# Patient Record
Sex: Male | Born: 1985 | Race: Black or African American | Hispanic: No | Marital: Single
Health system: Western US, Academic
[De-identification: ages and names within clinical notes are randomized; demographics above are authoritative.]

## PROBLEM LIST (undated history)

## (undated) DIAGNOSIS — F419 Anxiety disorder, unspecified: Secondary | ICD-10-CM

---

## 2018-03-30 ENCOUNTER — Emergency Department (HOSPITAL_COMMUNITY): Payer: Self-pay

## 2018-03-30 ENCOUNTER — Other Ambulatory Visit: Payer: Self-pay

## 2018-03-30 ENCOUNTER — Emergency Department (HOSPITAL_COMMUNITY)
Admission: EM | Admit: 2018-03-30 | Discharge: 2018-03-30 | Disposition: A | Discharge status: Home / Self Care | Payer: Self-pay | Attending: Emergency Medicine | Admitting: Emergency Medicine

## 2018-03-30 ENCOUNTER — Encounter (HOSPITAL_COMMUNITY): Payer: Self-pay | Admitting: Emergency Medicine

## 2018-03-30 DIAGNOSIS — J069 Acute upper respiratory infection, unspecified: Secondary | ICD-10-CM | POA: Insufficient documentation

## 2018-03-30 HISTORY — DX: Anxiety disorder, unspecified: F41.9

## 2018-03-30 MED ORDER — IBUPROFEN 800 MG PO TABS
800.0000 mg | ORAL_TABLET | Freq: Once | ORAL | Status: AC
  Administered 2018-03-30: 800 mg via ORAL
  Filled 2018-03-30: qty 1

## 2018-03-30 MED ORDER — BENZONATATE 100 MG PO CAPS: 100.0000 mg | ORAL_CAPSULE | Freq: Three times a day (TID) | ORAL | 0 refills | Status: AC | PRN

## 2018-03-30 MED ORDER — FLUTICASONE PROPIONATE 50 MCG/ACT NA SUSP: 1.0000 | Freq: Every day | NASAL | 2 refills | Status: AC

## 2018-03-30 MED ORDER — IBUPROFEN 800 MG PO TABS: 800.0000 mg | ORAL_TABLET | Freq: Three times a day (TID) | ORAL | 0 refills | Status: AC

## 2018-03-30 NOTE — ED Notes (Signed)
Patient transported to X-ray 

## 2018-03-30 NOTE — ED Triage Notes (Signed)
Pt states 2 days of sneezing, cough with yellow mucous, pt states "its the pollen" Pt states he has taken benadryl 2 days ago. States he stopped taking it for work. Denies headache.

## 2018-03-30 NOTE — ED Provider Notes (Signed)
[facility] Provider Note   CSN: [id number] Arrival date & time: 03/30/18  1500     History   Chief Complaint Chief Complaint  Patient presents with  . URI    HPI Oscar Sawyer is a 32 y.o. male with history of anxiety who presents to the emergency department today complaining of URI symptoms over the past 2 days.  Patient states he has had congestion, rhinorrhea, sore throat, and productive cough with clean green/mucousy sputum.  He states that he had a lot of significant coughing spells the day that his symptoms started and resulted in some anterior chest wall discomfort, he states this occurs with coughing, with a deep breath, and if he presses on his chest.  If he is resting at baseline he has no chest discomfort.  Has not tried intervention prior to arrival.  No other specific alleviating/aggravating factors.  Denies fever chills, dyspnea, leg pain/swelling, hemoptysis, recent surgery/trauma, recent long travel, hormone use, personal hx of cancer, or hx of DVT/PE.    HPI  Past Medical History:  Diagnosis Date  . Anxiety     There are no active problems to display for this patient.   History reviewed. No pertinent surgical history.      Home Medications    Prior to Admission medications   Not on File    Family History History reviewed. No pertinent family history.  Social History Social History   Tobacco Use  . Smoking status: Not on file  Substance Use Topics  . Alcohol use: Not on file  . Drug use: Not on file     Allergies   Patient has no known allergies.   Review of Systems Review of Systems  Constitutional: Negative for chills and fever.  HENT: Positive for congestion, rhinorrhea and sore throat. Negative for ear pain.   Respiratory: Positive for cough. Negative for shortness of breath.   Cardiovascular: Positive for chest pain (anterior chest wall with coughing/palpation/deep breaths). Negative for  palpitations and leg swelling.  Gastrointestinal: Negative for nausea and vomiting.  Musculoskeletal: Negative for myalgias (leg).   Physical Exam Updated Vital Signs BP 109/81   Pulse 63   Temp 98.8 F (37.1 C) (Oral)   Resp 18   SpO2 100%   Physical Exam  Constitutional: He appears well-developed and well-nourished.  Non-toxic appearance. No distress.  HENT:  Head: Normocephalic and atraumatic.  Right Ear: Tympanic membrane is not erythematous, not retracted and not bulging.  Left Ear: Tympanic membrane is not erythematous, not retracted and not bulging.  Nose: Mucosal edema (congestion, somewhat boggy turbinates) present. Right sinus exhibits no maxillary sinus tenderness and no frontal sinus tenderness. Left sinus exhibits no maxillary sinus tenderness and no frontal sinus tenderness.  Mouth/Throat: Uvula is midline and oropharynx is clear and moist. No oropharyngeal exudate or posterior oropharyngeal erythema.  Eyes: Pupils are equal, round, and reactive to light. EOM are normal. Right eye exhibits no discharge. Left eye exhibits no discharge. Right conjunctiva is injected (minimal). Left conjunctiva is injected (minimal).  Neck: Normal range of motion. Neck supple.  Cardiovascular: Normal rate and regular rhythm.  No murmur heard. Pulmonary/Chest: Effort normal and breath sounds normal. No stridor. No respiratory distress. He has no wheezes. He has no rhonchi. He has no rales. He exhibits tenderness (anterior). He exhibits no crepitus, no edema, no deformity, no swelling and no retraction.  Abdominal: Soft. He exhibits no distension. There is no tenderness.  Lymphadenopathy:    He  has no cervical adenopathy.  Neurological: He is alert.  Clear speech.   Skin: Skin is warm and dry. No rash noted.  Psychiatric: He has a normal mood and affect. His behavior is normal.  Nursing note and vitals reviewed.    ED Treatments / Results  Labs (all labs ordered are listed, but only  abnormal results are displayed) Labs Reviewed - No data to display  EKG None  Radiology Dg Chest 2 View  Result Date: 03/30/2018 CLINICAL DATA:  Cough and shortness of breath EXAM: CHEST - 2 VIEW COMPARISON:  None. FINDINGS: The heart size and mediastinal contours are within normal limits. Both lungs are clear. The visualized skeletal structures are unremarkable. IMPRESSION: No active cardiopulmonary disease. Electronically Signed   By: Alcide Clever M.D.   On: 03/30/2018 16:46    Procedures Procedures (including critical care time)  Medications Ordered in ED Medications  ibuprofen (ADVIL,MOTRIN) tablet 800 mg (800 mg Oral Given 03/30/18 1708)     Initial Impression / Assessment and Plan / ED Course  I have reviewed the triage vital signs and the nursing notes.  Pertinent labs & imaging results that were available during my care of the patient were reviewed by me and considered in my medical decision making (see chart for details).   Patient presents with URI symptoms that started 2 days ago.  Patient is nontoxic-appearing, no apparent distress, vitals WNL.  Patient's lungs are CTA, chest x-ray ordered per triage negative for infiltrate, no respiratory distress, doubt pneumonia.  Given onset of symptoms 2 days prior, patient is afebrile, no sinus tenderness, doubt acute bacterial sinusitis.  Centor score 0, doubt strep pharyngitis.  Patient with chest discomfort only with coughing/deep breath/palpation to chest wall (this is reproducible), he is PERC negative, suspect pleurisy vs. Chest wall muscle discomfort.  No signs of respiratory distress. Likely allergic versus viral etiology.  Will treat supportively with Flonase, Tessalon, and ibuprofen. I discussed results, treatment plan, need for PCP follow-up, and return precautions with the patient. Provided opportunity for questions, patient confirmed understanding and is in agreement with plan.   Final Clinical Impressions(s) / ED Diagnoses     Final diagnoses:  Upper respiratory tract infection, unspecified type    ED Discharge Orders        Ordered    fluticasone (FLONASE) 50 MCG/ACT nasal spray  Daily     03/30/18 1652    benzonatate (TESSALON) 100 MG capsule  3 times daily PRN     03/30/18 1652    ibuprofen (ADVIL,MOTRIN) 800 MG tablet  3 times daily     03/30/18 [id number] [place] R, PA-C 03/30/18 1725    Raeford Razor, MD 04/01/18 1008

## 2018-03-30 NOTE — Discharge Instructions (Addendum)
You were seen in the emergency department today for upper respiratory tract symptoms.  Your x-ray did not show signs of pneumonia.  We suspect your symptoms are likely related to allergies or to a virus.  I have prescribed you multiple medications to treat your symptoms.   -Flonase to be used 1 spray in each nostril daily.  This medication is used to treat your congestion.  -Tessalon can be taken once every 8 hours as needed.  This medication is used to treat your cough.  -Ibuprofen to be taken once every 8 hours as needed for pain.  We have prescribed you new medication(s) today. Discuss the medications prescribed today with your pharmacist as they can have adverse effects and interactions with your other medicines including over the counter and prescribed medications. Seek medical evaluation if you start to experience new or abnormal symptoms after taking one of these medicines, seek care immediately if you start to experience difficulty breathing, feeling of your throat closing, facial swelling, or rash as these could be indications of a more serious allergic reaction   You will need to follow-up with your primary care provider in 1 week if your symptoms have not improved.  If you do not have a primary care provider one is provided in your discharge instructions.  Return to the emergency department for any new or worsening symptoms or any other concerns you may have.

## 2018-06-27 ENCOUNTER — Emergency Department (HOSPITAL_COMMUNITY)
Admission: EM | Admit: 2018-06-27 | Discharge: 2018-06-27 | Disposition: A | Discharge status: Home / Self Care | Payer: Self-pay | Attending: Emergency Medicine | Admitting: Emergency Medicine

## 2018-06-27 ENCOUNTER — Encounter (HOSPITAL_COMMUNITY): Payer: Self-pay | Admitting: Emergency Medicine

## 2018-06-27 DIAGNOSIS — R1084 Generalized abdominal pain: Secondary | ICD-10-CM

## 2018-06-27 DIAGNOSIS — E860 Dehydration: Secondary | ICD-10-CM

## 2018-06-27 DIAGNOSIS — F17210 Nicotine dependence, cigarettes, uncomplicated: Secondary | ICD-10-CM | POA: Insufficient documentation

## 2018-06-27 LAB — COMPREHENSIVE METABOLIC PANEL
ALT: 15 U/L (ref 0–44)
ANION GAP: 9 (ref 5–15)
AST: 23 U/L (ref 15–41)
Albumin: 4.2 g/dL (ref 3.5–5.0)
Alkaline Phosphatase: 41 U/L (ref 38–126)
BILIRUBIN TOTAL: 0.8 mg/dL (ref 0.3–1.2)
BUN: 15 mg/dL (ref 6–20)
CHLORIDE: 105 mmol/L (ref 98–111)
CO2: 26 mmol/L (ref 22–32)
Calcium: 9.1 mg/dL (ref 8.9–10.3)
Creatinine, Ser: 1.3 mg/dL — ABNORMAL HIGH (ref 0.61–1.24)
GFR calc Af Amer: >60 mL/min (ref >60)
Glucose, Bld: 95 mg/dL (ref 70–99)
POTASSIUM: 3.9 mmol/L (ref 3.5–5.1)
Sodium: 140 mmol/L (ref 135–145)
TOTAL PROTEIN: 6.9 g/dL (ref 6.5–8.1)

## 2018-06-27 LAB — URINALYSIS, ROUTINE W REFLEX MICROSCOPIC
GLUCOSE, UA: NEGATIVE mg/dL
Hgb urine dipstick: NEGATIVE
KETONES UR: 5 mg/dL — AB
LEUKOCYTES UA: NEGATIVE
NITRITE: NEGATIVE
Protein, ur: NEGATIVE mg/dL
SPECIFIC GRAVITY, URINE: 1.033 — AB (ref 1.005–1.030)
pH: 5 (ref 5.0–8.0)

## 2018-06-27 LAB — CBC
HEMATOCRIT: 49.8 % (ref 39.0–52.0)
HEMOGLOBIN: 16.1 g/dL (ref 13.0–17.0)
MCH: 31.1 pg (ref 26.0–34.0)
MCHC: 32.3 g/dL (ref 30.0–36.0)
MCV: 96.1 fL (ref 78.0–100.0)
Platelets: 173 10*3/uL (ref 150–400)
RBC: 5.18 MIL/uL (ref 4.22–5.81)
RDW: 11.9 % (ref 11.5–15.5)
WBC: 7.2 10*3/uL (ref 4.0–10.5)

## 2018-06-27 LAB — LIPASE, BLOOD: LIPASE: 34 U/L (ref 11–51)

## 2018-06-27 MED ORDER — ACETAMINOPHEN 500 MG PO TABS
1000.0000 mg | ORAL_TABLET | Freq: Once | ORAL | Status: AC
  Administered 2018-06-27: 1000 mg via ORAL
  Filled 2018-06-27: qty 2

## 2018-06-27 MED ORDER — SODIUM CHLORIDE 0.9 % IV SOLN: 25.0000 mg | INTRAVENOUS | Status: DC | PRN

## 2018-06-27 NOTE — Discharge Instructions (Addendum)
Please continue to hydrate appropriately (6-8 large cups of water per day). You can take Tylenol 1000 mg every 6 hours for abdominal pain and discomfort. If the symptoms for 1 week, please see you primary care doctor.  We have attached a list of local primary care doctors in the area. Please follow-up with one of them to retest your urine and blood sugar.   We collected samples for STD testing (Gonorrhea, Chlamydia, syphilis, and HIV). You should receive a call if anything comes back abnormal. You can also find your test results on the patient portal (directions attached).

## 2018-06-27 NOTE — ED Notes (Signed)
ED Provider at bedside. [place] 

## 2018-06-27 NOTE — ED Triage Notes (Signed)
Pt states over the weekend he developed a pressure in his lower abd. Pt states when he urinates, he feels pressure. No pain with urination. States his urine is dark. Pt also he was his CBG checked because he was told he was borderline diabetic.

## 2018-06-27 NOTE — ED Provider Notes (Signed)
[facility] Provider Note   CSN: [id number] Arrival date & time: 06/27/18  1008     History   Chief Complaint Chief Complaint  Patient presents with  . Abdominal Pain    HPI Oscar Sawyer is a 32 y.o. male with no significant past medical history who presents with a 4 day history of abdominal pain. Patient reports that he woke up on Friday morning and had an abrupt onset of "abdominal pressure" in his lower suprapubic region. He went to use the restroom and began urinating. He says that he stopped urinating and had "dribbling" of urine from his penis. He endorses increased frequency but denies dysuria, hematuria, fevers, penile discharge.  He reports that he is sexually active and has been using condoms with sexual partners. He does report that during his last sexual encounter the condom may have broken.   He denies diarrhea, SOB, chest pain. He reports a history of hemorrhoidectomy 7-8 months ago but has not undergone an abdominal surgery in the past.     Past Medical History:  Diagnosis Date  . Anxiety     There are no active problems to display for this patient.   History reviewed. No pertinent surgical history.      Home Medications    Prior to Admission medications   Medication Sig Start Date End Date Taking? Authorizing Provider  benzonatate (TESSALON) 100 MG capsule Take 1 capsule (100 mg total) by mouth 3 (three) times daily as needed. Patient not taking: Reported on 06/27/2018 03/30/18   Petrucelli, Lelon MastSamantha R, PA-C  fluticasone (FLONASE) 50 MCG/ACT nasal spray Place 1 spray into both nostrils daily. Patient not taking: Reported on 06/27/2018 03/30/18   Petrucelli, Pleas KochSamantha R, PA-C  ibuprofen (ADVIL,MOTRIN) 800 MG tablet Take 1 tablet (800 mg total) by mouth 3 (three) times daily. Patient not taking: Reported on 06/27/2018 03/30/18   Petrucelli, Pleas KochSamantha R, PA-C    Family History History reviewed. No pertinent family  history.  Social History Social History   Tobacco Use  . Smoking status: Current Some Day Smoker    Types: Cigarettes  . Smokeless tobacco: Never Used  Substance Use Topics  . Alcohol use: Yes    Comment: occ  . Drug use: Never     Allergies   Patient has no known allergies.   Review of Systems Review of Systems  Constitutional: Negative for activity change, appetite change, chills and fever.  HENT: Negative for congestion and sore throat.   Respiratory: Negative for cough, choking, chest tightness and shortness of breath.   Cardiovascular: Negative for chest pain and palpitations.  Gastrointestinal: Positive for abdominal pain. Negative for abdominal distention, constipation, diarrhea, nausea and vomiting.  Genitourinary: Positive for difficulty urinating and frequency. Negative for decreased urine volume, discharge, dysuria, flank pain, hematuria, penile pain, penile swelling, scrotal swelling, testicular pain and urgency.  Musculoskeletal: Positive for back pain.  Skin: Negative for rash.  Neurological: Negative for dizziness, light-headedness and headaches.  All other systems reviewed and are negative.    Physical Exam Updated Vital Signs BP 125/81   Pulse (!) 53   Temp 98.3 F (36.8 C) (Oral)   Resp 16   Ht 5\' 11"  (1.803 m)   Wt 81.6 kg (180 lb)   SpO2 99%   BMI 25.10 kg/m   Physical Exam  Constitutional: He is oriented to person, place, and time. He appears well-developed and well-nourished.  HENT:  Head: Normocephalic and atraumatic.  Eyes: Pupils are equal,  round, and reactive to light. EOM are normal.  Cardiovascular: Normal rate, regular rhythm and normal heart sounds.  Pulmonary/Chest: Effort normal and breath sounds normal.  Abdominal: Normal appearance and bowel sounds are normal. There is generalized tenderness. There is no CVA tenderness.  Genitourinary: Testes normal and penis normal.  Genitourinary Comments: No penile discharge  Neurological:  He is alert and oriented to person, place, and time.  Skin: Skin is warm and dry.  Psychiatric: He has a normal mood and affect. His behavior is normal.     ED Treatments / Results  Labs (all labs ordered are listed, but only abnormal results are displayed) Labs Reviewed  COMPREHENSIVE METABOLIC PANEL - Abnormal; Notable for the following components:      Result Value   Creatinine, Ser 1.30 (*)    All other components within normal limits  URINALYSIS, ROUTINE W REFLEX MICROSCOPIC - Abnormal; Notable for the following components:   Specific Gravity, Urine 1.033 (*)    Bilirubin Urine SMALL (*)    Ketones, ur 5 (*)    All other components within normal limits  LIPASE, BLOOD  CBC  RPR  HIV ANTIBODY (ROUTINE TESTING)  GC/CHLAMYDIA PROBE AMP ([place]) NOT AT [facility]    EKG None  Radiology No results found.  Procedures Procedures (including critical care time)  Medications Ordered in ED Medications  acetaminophen (TYLENOL) tablet 1,000 mg (has no administration in time range)     Initial Impression / Assessment and Plan / ED Course  I have reviewed the triage vital signs and the nursing notes.  Pertinent labs & imaging results that were available during my care of the patient were reviewed by me and considered in my medical decision making (see chart for details).  Clinical Course as of Jun 28 1247  Tue Jun 27, 2018  1207 Creatinine(!): 1.30 [JP]  1207 Creatinine(!): 1.30 [JP]  1207 Specific Gravity, Urine(!): 1.033 [JP]  1207 Specific Gravity, Urine(!): 1.033 [JP]    Clinical Course User Index [JP] Synetta Shadow, MD    32 y.o. male with no significant past medical history who presents with a 4 day history of abdominal pain. Differential diagnosis includes dehydration and sexually transmitted infection (less likely due lack of penile discharge). Symptoms are less likely due to a urinary tract infection given lack of dysuria and urinalysis without signs of  UTI.  #Abdominal pain - Urinalysis shows high specific gravity (1.033), small bilirubin, and ketonuria (5). Most consistent with dehydration. Encouraged oral rehydration in the ED. Recommended patient drink 6-8 large glasses of water everyday.  - Lipase negative - Ordered GC/Chlamydia, HIV, and Syphilis test to rule out STI. Patient will be contacted with abnormal results  #Acute Kidney Injury (creatinine 1.30) - Most likely pre-renal in nature given history of decreased PO fluid intake and urinalysis findings. - Recommended patient follow-up with PCP for repeat urinalysis.   Final Clinical Impressions(s) / ED Diagnoses   Final diagnoses:  Generalized abdominal pain  Dehydration    ED Discharge Orders    None       Synetta Shadow, MD 06/27/18 0981    Marily Memos, MD 06/27/18 2108

## 2018-06-27 NOTE — ED Notes (Signed)
ED Provider at bedside. 

## 2018-06-27 NOTE — ED Notes (Signed)
Patient came out stated that he wants to go. He states that he has wants to go home.

## 2018-06-28 LAB — HIV ANTIBODY (ROUTINE TESTING W REFLEX): HIV SCREEN 4TH GENERATION: NONREACTIVE

## 2018-06-28 LAB — GC/CHLAMYDIA PROBE AMP (~~LOC~~) NOT AT ARMC
CHLAMYDIA, DNA PROBE: NEGATIVE
NEISSERIA GONORRHEA: NEGATIVE

## 2018-06-28 LAB — RPR: RPR: NONREACTIVE

## 2018-08-12 ENCOUNTER — Encounter (HOSPITAL_COMMUNITY): Payer: Self-pay | Admitting: Emergency Medicine

## 2018-08-12 ENCOUNTER — Other Ambulatory Visit: Payer: Self-pay

## 2018-08-12 ENCOUNTER — Emergency Department (HOSPITAL_COMMUNITY)
Admission: EM | Admit: 2018-08-12 | Discharge: 2018-08-12 | Disposition: A | Discharge status: Home / Self Care | Payer: Self-pay | Attending: Emergency Medicine | Admitting: Emergency Medicine

## 2018-08-12 DIAGNOSIS — Z202 Contact with and (suspected) exposure to infections with a predominantly sexual mode of transmission: Secondary | ICD-10-CM | POA: Insufficient documentation

## 2018-08-12 DIAGNOSIS — F17210 Nicotine dependence, cigarettes, uncomplicated: Secondary | ICD-10-CM | POA: Insufficient documentation

## 2018-08-12 DIAGNOSIS — N453 Epididymo-orchitis: Secondary | ICD-10-CM | POA: Insufficient documentation

## 2018-08-12 LAB — CBC
HCT: 44.5 % (ref 39.0–52.0)
Hemoglobin: 14.3 g/dL (ref 13.0–17.0)
MCH: 31 pg (ref 26.0–34.0)
MCHC: 32.1 g/dL (ref 30.0–36.0)
MCV: 96.3 fL (ref 78.0–100.0)
Platelets: 159 K/uL (ref 150–400)
RBC: 4.62 MIL/uL (ref 4.22–5.81)
RDW: 11.9 % (ref 11.5–15.5)
WBC: 5.9 K/uL (ref 4.0–10.5)

## 2018-08-12 LAB — URINALYSIS, ROUTINE W REFLEX MICROSCOPIC
Bilirubin Urine: NEGATIVE
Glucose, UA: NEGATIVE mg/dL
Hgb urine dipstick: NEGATIVE
Ketones, ur: NEGATIVE mg/dL
Leukocytes, UA: NEGATIVE
Nitrite: NEGATIVE
Protein, ur: NEGATIVE mg/dL
Specific Gravity, Urine: 1.025 (ref 1.005–1.030)
pH: 7 (ref 5.0–8.0)

## 2018-08-12 LAB — BASIC METABOLIC PANEL WITH GFR
Anion gap: 6 (ref 5–15)
BUN: 13 mg/dL (ref 6–20)
CO2: 28 mmol/L (ref 22–32)
Calcium: 8.8 mg/dL — ABNORMAL LOW (ref 8.9–10.3)
Chloride: 104 mmol/L (ref 98–111)
Creatinine, Ser: 1.12 mg/dL (ref 0.61–1.24)
GFR calc Af Amer: >60 mL/min (ref >60)
GFR calc non Af Amer: >60 mL/min (ref >60)
Glucose, Bld: 128 mg/dL — ABNORMAL HIGH (ref 70–99)
Potassium: 3.8 mmol/L (ref 3.5–5.1)
Sodium: 138 mmol/L (ref 135–145)

## 2018-08-12 MED ORDER — CEFTRIAXONE SODIUM 250 MG IJ SOLR
250.0000 mg | Freq: Once | INTRAMUSCULAR | Status: AC
  Administered 2018-08-12: 250 mg via INTRAMUSCULAR
  Filled 2018-08-12: qty 250

## 2018-08-12 MED ORDER — LIDOCAINE HCL (PF) 1 % IJ SOLN
INTRAMUSCULAR | Status: AC
  Administered 2018-08-12: 2 mL
  Filled 2018-08-12: qty 5

## 2018-08-12 MED ORDER — DOXYCYCLINE HYCLATE 100 MG PO CAPS: 100.0000 mg | ORAL_CAPSULE | Freq: Two times a day (BID) | ORAL | 0 refills | Status: AC

## 2018-08-12 NOTE — ED Triage Notes (Signed)
Pt reports urinary frequency and painful urination for the past few days. Denies any discharge, fever or chills

## 2018-08-12 NOTE — ED Notes (Signed)
While reviewing discharge instructions patient stated he was to be given antibiotic shot. PA notified and waiting orders prior to discharge.

## 2018-08-12 NOTE — Discharge Instructions (Signed)
Please take all of your antibiotics until finished!   You may develop abdominal discomfort or diarrhea from the antibiotic.  You may help offset this with probiotics which you can buy or get in yogurt. Do not eat  or take the probiotics until 2 hours after your antibiotic.   Alternate 600 mg of ibuprofen and [phone] mg of Tylenol every 3 hours as needed for pain. Do not exceed 4000 mg of Tylenol daily.  Wear underwear that will support you such as briefs.  This will help with testicular pain.  You have lab work pending that you will be called if it is abnormal.  If you have any abnormal STD test you should inform your sexual partners and they should go to the health department for further testing and treatment.  Return to the emergency department if any concerning signs or symptoms develop such as fevers, worsening abdominal pain, persistent vomiting.

## 2018-08-12 NOTE — ED Provider Notes (Signed)
[facility] EMERGENCY DEPARTMENT Provider Note   CSN: [id number] Arrival date & time: 08/12/18  0908     History   Chief Complaint Chief Complaint  Patient presents with  . Urinary Frequency    HPI Oscar HeadlandLamar Sawyer is a 32 y.o. male presents today for evaluation of acute onset, persisting dysuria and suprapubic pain for 3 days.  He notes mild sensation of tightness in the suprapubic region but does not radiate.  The sensation worsens when urinating.  He notes dysuria and a tingling pain at the tip of his penis.  He denies any urethral discharge, testicular pain or swelling, fevers, nausea, or vomiting.  He denies pain with bowel movements.  He is currently sexually active with one male partner but does not always use protection.  He is requesting STD testing today but declines HIV or syphilis testing.  Has not tried anything for his symptoms.  The history is provided by the patient.    Past Medical History:  Diagnosis Date  . Anxiety     There are no active problems to display for this patient.   History reviewed. No pertinent surgical history.      Home Medications    Prior to Admission medications   Medication Sig Start Date End Date Taking? Authorizing Provider  benzonatate (TESSALON) 100 MG capsule Take 1 capsule (100 mg total) by mouth 3 (three) times daily as needed. Patient not taking: Reported on 06/27/2018 03/30/18   Petrucelli, Pleas KochSamantha R, PA-C  doxycycline (VIBRAMYCIN) 100 MG capsule Take 1 capsule (100 mg total) by mouth 2 (two) times daily for 10 days. 08/12/18 08/22/18  Michela PitcherFawze, Alonnah Lampkins A, PA-C  fluticasone (FLONASE) 50 MCG/ACT nasal spray Place 1 spray into both nostrils daily. Patient not taking: Reported on 06/27/2018 03/30/18   Petrucelli, Pleas KochSamantha R, PA-C  ibuprofen (ADVIL,MOTRIN) 800 MG tablet Take 1 tablet (800 mg total) by mouth 3 (three) times daily. Patient not taking: Reported on 06/27/2018 03/30/18   Petrucelli, Pleas KochSamantha R, PA-C    Family  History No family history on file.  Social History Social History   Tobacco Use  . Smoking status: Current Some Day Smoker    Types: Cigarettes  . Smokeless tobacco: Never Used  Substance Use Topics  . Alcohol use: Yes    Comment: occ  . Drug use: Never     Allergies   Patient has no known allergies.   Review of Systems Review of Systems  Constitutional: Negative for chills and fever.  Respiratory: Negative for shortness of breath.   Cardiovascular: Negative for chest pain.  Gastrointestinal: Positive for abdominal pain. Negative for constipation, diarrhea, nausea and vomiting.  Genitourinary: Positive for dysuria, frequency and urgency. Negative for discharge, hematuria, penile pain, penile swelling and scrotal swelling.     Physical Exam Updated Vital Signs BP 118/68 (BP Location: Right Arm)   Pulse (!) 55   Temp 98.5 F (36.9 C) (Oral)   Resp 12   SpO2 100%   Physical Exam  Constitutional: He appears well-developed and well-nourished. No distress.  Resting comfortably in bed  HENT:  Head: Normocephalic and atraumatic.  Eyes: Conjunctivae are normal. Right eye exhibits no discharge. Left eye exhibits no discharge.  Neck: No JVD present. No tracheal deviation present.  Cardiovascular: Normal rate, regular rhythm and normal heart sounds.  Pulmonary/Chest: Effort normal and breath sounds normal.  Abdominal: Soft. Bowel sounds are normal. He exhibits no distension and no mass. There is tenderness. There is no guarding.  Very  mild discomfort on palpation of the suprapubic region.  Murphy sign absent, Rovsing's absent, no CVA tenderness  Genitourinary:  Genitourinary Comments: Examination performed in the presence of a chaperone.  Bilateral inguinal lymphadenopathy noted.  No scrotal swelling noted although the testes are tender to palpation bilaterally particularly overlying each epididymis.  No abnormal lie, cremasteric reflex intact.  Prehn's sign absent.  No  urethral discharge noted  Musculoskeletal: He exhibits no edema.  Neurological: He is alert.  Skin: Skin is warm and dry. No erythema.  Psychiatric: He has a normal mood and affect. His behavior is normal.  Nursing note and vitals reviewed.    ED Treatments / Results  Labs (all labs ordered are listed, but only abnormal results are displayed) Labs Reviewed  BASIC METABOLIC PANEL - Abnormal; Notable for the following components:      Result Value   Glucose, Bld 128 (*)    Calcium 8.8 (*)    All other components within normal limits  URINE CULTURE  URINALYSIS, ROUTINE W REFLEX MICROSCOPIC  CBC  GC/CHLAMYDIA PROBE AMP ([place]) NOT AT [facility]    EKG None  Radiology No results found.  Procedures Procedures (including critical care time)  Medications Ordered in ED Medications - No data to display   Initial Impression / Assessment and Plan / ED Course  I have reviewed the triage vital signs and the nursing notes.  Pertinent labs & imaging results that were available during my care of the patient were reviewed by me and considered in my medical decision making (see chart for details).     Patient with dysuria and suprapubic pain for 3 days.  He is afebrile, vital signs are stable.  He is nontoxic in appearance.  No peritoneal signs on examination of the abdomen.  GU exam shows testicular and epididymal tenderness to palpation bilaterally.  No evidence of testicular torsion.  No pain with bowel movements to suggest prostatitis.  GC chlamydia cultures obtained.  UA does not show evidence of UTI or nephrolithiasis.  Doubt obstruction, perforation, appendicitis, colitis, or other acute surgical abdominal pathology.  Will discharge with 10-day course of doxycycline.  IM Rocephin given in the ED.  He is aware that cultures are pending and if he has any abnormal lab work he should inform his sexual partners and they should go to the health department for further testing or treatment.   Discussed strict ED return precautions.  Recommend follow-up with PCP as needed. Pt verbalized understanding of and agreement with plan and is safe for discharge home at this time.   Final Clinical Impressions(s) / ED Diagnoses   Final diagnoses:  Epididymo-orchitis, acute    ED Discharge Orders         Ordered    doxycycline (VIBRAMYCIN) 100 MG capsule  2 times daily     08/12/18 1151           Jeanie Sewer, PA-C 08/12/18 1154    Bethann Berkshire, MD 08/13/18 [phone]

## 2018-08-12 NOTE — ED Notes (Signed)
Patient moved into room for PA to assess. Swab at bedside waiting for provider. PA notified.

## 2018-08-13 LAB — URINE CULTURE: CULTURE: NO GROWTH

## 2018-08-15 LAB — GC/CHLAMYDIA PROBE AMP (~~LOC~~) NOT AT ARMC
Chlamydia: NEGATIVE
Neisseria Gonorrhea: NEGATIVE

## 2018-10-13 ENCOUNTER — Encounter (HOSPITAL_BASED_OUTPATIENT_CLINIC_OR_DEPARTMENT_OTHER): Payer: Self-pay

## 2018-10-13 ENCOUNTER — Emergency Department (HOSPITAL_BASED_OUTPATIENT_CLINIC_OR_DEPARTMENT_OTHER)
Admission: EM | Admit: 2018-10-13 | Discharge: 2018-10-13 | Disposition: A | Discharge status: Home / Self Care | Payer: Self-pay | Attending: Emergency Medicine | Admitting: Emergency Medicine

## 2018-10-13 DIAGNOSIS — F17210 Nicotine dependence, cigarettes, uncomplicated: Secondary | ICD-10-CM | POA: Insufficient documentation

## 2018-10-13 DIAGNOSIS — F419 Anxiety disorder, unspecified: Secondary | ICD-10-CM | POA: Insufficient documentation

## 2018-10-13 DIAGNOSIS — R079 Chest pain, unspecified: Secondary | ICD-10-CM | POA: Insufficient documentation

## 2018-10-13 NOTE — Discharge Instructions (Signed)
If your symptoms worsen, or you have additional concerns please seek additional medical care and evaluation.  The [facility] or interactive resource center of [place] has many helpful resources.

## 2018-10-13 NOTE — ED Notes (Signed)
Per EMS pt was at work and had a panic attack, states working 2 jobs and living in the shelter d/t roommates during drugs. C/o chest tightness

## 2018-10-13 NOTE — ED Provider Notes (Addendum)
[facility] EMERGENCY DEPARTMENT Provider Note   CSN: [id number] Arrival date & time: 10/13/18  1338     History   Chief Complaint Chief Complaint  Patient presents with  . Panic Attack    HPI Oscar Sawyer is a 32 y.o. male with past medical history of anxiety, who presents today for evaluation of chest tightness.  He reports that this feels similar to previous panic attacks he has had.  He says that recently he has been under a significant amount of stress.  He moved here from [place] and is been trying to get reestablished.  He is currently staying with a shelter as he reports his roommates are using drugs.  He says that he is working 2 jobs to try and be able to move out of the shelter.  He was at work today checking back and after lunch break when he started to feel tightness in his chest with slight shortness of breath.  It is in the middle of his chest, does not radiate or move.  No diaphoresis, nausea or vomiting.    Does not have any family history of cardiac arrest before the age of 17 or heart attacks.  No personal or family history of blood clots.  No recent surgeries or immobilizations.  Denies any leg swelling, cough, fevers, or coughing up blood.    He denies SI, HI, or AVH.  HPI  Past Medical History:  Diagnosis Date  . Anxiety     There are no active problems to display for this patient.   History reviewed. No pertinent surgical history.      Home Medications    Prior to Admission medications   Medication Sig Start Date End Date Taking? Authorizing Provider  benzonatate (TESSALON) 100 MG capsule Take 1 capsule (100 mg total) by mouth 3 (three) times daily as needed. Patient not taking: Reported on 06/27/2018 03/30/18   Petrucelli, Lelon Mast R, PA-C  fluticasone (FLONASE) 50 MCG/ACT nasal spray Place 1 spray into both nostrils daily. Patient not taking: Reported on 06/27/2018 03/30/18   Petrucelli, Pleas Koch, PA-C  ibuprofen (ADVIL,MOTRIN) 800 MG  tablet Take 1 tablet (800 mg total) by mouth 3 (three) times daily. Patient not taking: Reported on 06/27/2018 03/30/18   Petrucelli, Pleas Koch, PA-C    Family History No family history on file.  Social History Social History   Tobacco Use  . Smoking status: Current Some Day Smoker    Types: Cigarettes  . Smokeless tobacco: Never Used  Substance Use Topics  . Alcohol use: Yes    Comment: occ  . Drug use: Never     Allergies   Patient has no known allergies.   Review of Systems Review of Systems  Constitutional: Negative for chills and fever.  Respiratory: Positive for shortness of breath (Resolved). Negative for cough.   Cardiovascular: Positive for chest pain (Improving). Negative for leg swelling.  Gastrointestinal: Negative for abdominal pain.  Neurological: Negative for weakness and headaches.  All other systems reviewed and are negative.    Physical Exam Updated Vital Signs BP 121/75 (BP Location: Right Arm)   Pulse 70   Temp 98.4 F (36.9 C) (Oral)   Resp 18   SpO2 99%   Physical Exam  Constitutional: He appears well-developed. No distress.  HENT:  Head: Normocephalic and atraumatic.  Neck: Trachea normal and full passive range of motion without pain. No tracheal deviation present.  Cardiovascular: Normal rate, regular rhythm and normal heart sounds.  No murmur  heard. Pulmonary/Chest: Effort normal and breath sounds normal. No respiratory distress.  Abdominal: He exhibits no distension.  Musculoskeletal:  No swelling or tenderness to palpation of bilateral lower extremities.  Neurological: He is alert.  Skin: Skin is warm and dry. He is not diaphoretic.  Psychiatric: He has a normal mood and affect. His speech is normal and behavior is normal. He is not actively hallucinating. Cognition and memory are normal. He does not express impulsivity. He expresses no homicidal and no suicidal ideation. He expresses no suicidal plans and no homicidal plans.    Sleeping when I walked in the room.  He is attentive.  Nursing note and vitals reviewed.    ED Treatments / Results  Labs (all labs ordered are listed, but only abnormal results are displayed) Labs Reviewed - No data to display  EKG None  Radiology No results found.  Procedures Procedures (including critical care time)  Medications Ordered in ED Medications - No data to display   Initial Impression / Assessment and Plan / ED Course  I have reviewed the triage vital signs and the nursing notes.  Pertinent labs & imaging results that were available during my care of the patient were reviewed by me and considered in my medical decision making (see chart for details).    Patient is to be discharged with recommendation to follow up with PCP in regards to today's hospital visit. Chest pain is not likely of cardiac or pulmonary etiology d/t presentation, PERC negative, VSS, no tracheal deviation, no JVD or new murmur, RRR, breath sounds equal bilaterally, EKG without acute abnormalities.  Patient states that he feels like his chest pain is similar to his anxiety related chest pain in the past and is not concerned for other causes.   Pt has been advised to return to the ED if CP becomes exertional, associated with diaphoresis or nausea, radiates to left jaw/arm, worsens or becomes concerning in any way. Pt appears reliable for follow up and is agreeable to discharge. Patient was offered additional testing including, but not limited to blood work, CXR, and observation which he declined.   He denies SI/HI/AVH.  Is calm during interview.  Given resources for outpatient psychiatric care.   Return precautions were discussed with patient who states their understanding.  At the time of discharge patient denied any unaddressed complaints or concerns.  Patient is agreeable for discharge home.  Final Clinical Impressions(s) / ED Diagnoses   Final diagnoses:  Chest pain, unspecified type   Anxiety    ED Discharge Orders    None        Cristina Gong, PA-C 10/13/18 1447    Gwyneth Sprout, MD 10/13/18 1519

## 2018-11-21 ENCOUNTER — Encounter (HOSPITAL_COMMUNITY): Payer: Self-pay | Admitting: Emergency Medicine

## 2018-11-21 ENCOUNTER — Emergency Department (HOSPITAL_COMMUNITY)
Admission: EM | Admit: 2018-11-21 | Discharge: 2018-11-21 | Disposition: A | Discharge status: Home / Self Care | Payer: Self-pay | Attending: Emergency Medicine | Admitting: Emergency Medicine

## 2018-11-21 ENCOUNTER — Other Ambulatory Visit: Payer: Self-pay

## 2018-11-21 DIAGNOSIS — Z202 Contact with and (suspected) exposure to infections with a predominantly sexual mode of transmission: Secondary | ICD-10-CM | POA: Insufficient documentation

## 2018-11-21 DIAGNOSIS — F17210 Nicotine dependence, cigarettes, uncomplicated: Secondary | ICD-10-CM | POA: Insufficient documentation

## 2018-11-21 DIAGNOSIS — R369 Urethral discharge, unspecified: Secondary | ICD-10-CM | POA: Insufficient documentation

## 2018-11-21 LAB — URINALYSIS, ROUTINE W REFLEX MICROSCOPIC
Bilirubin Urine: NEGATIVE
Glucose, UA: NEGATIVE mg/dL
Hgb urine dipstick: NEGATIVE
Ketones, ur: NEGATIVE mg/dL
LEUKOCYTES UA: NEGATIVE
NITRITE: NEGATIVE
Protein, ur: NEGATIVE mg/dL
SPECIFIC GRAVITY, URINE: 1.027 (ref 1.005–1.030)
pH: 7 (ref 5.0–8.0)

## 2018-11-21 LAB — WET PREP, GENITAL
CLUE CELLS WET PREP: NONE SEEN
Sperm: NONE SEEN
TRICH WET PREP: NONE SEEN
WBC, Wet Prep HPF POC: NONE SEEN
YEAST WET PREP: NONE SEEN

## 2018-11-21 LAB — GC/CHLAMYDIA PROBE AMP (~~LOC~~) NOT AT ARMC
CHLAMYDIA, DNA PROBE: NEGATIVE
NEISSERIA GONORRHEA: NEGATIVE

## 2018-11-21 MED ORDER — AZITHROMYCIN 250 MG PO TABS
1000.0000 mg | ORAL_TABLET | Freq: Once | ORAL | Status: AC
  Administered 2018-11-21: 1000 mg via ORAL
  Filled 2018-11-21: qty 4

## 2018-11-21 MED ORDER — CEFTRIAXONE SODIUM 250 MG IJ SOLR
250.0000 mg | Freq: Once | INTRAMUSCULAR | Status: AC
  Administered 2018-11-21: 250 mg via INTRAMUSCULAR
  Filled 2018-11-21: qty 250

## 2018-11-21 MED ORDER — BACITRACIN ZINC 500 UNIT/GM EX OINT
TOPICAL_OINTMENT | Freq: Two times a day (BID) | CUTANEOUS | Status: DC
  Administered 2018-11-21: 06:00:00 via TOPICAL

## 2018-11-21 MED ORDER — IBUPROFEN 400 MG PO TABS
600.0000 mg | ORAL_TABLET | Freq: Once | ORAL | Status: AC
  Administered 2018-11-21: 600 mg via ORAL
  Filled 2018-11-21: qty 1

## 2018-11-21 NOTE — ED Triage Notes (Signed)
Pt reports sore to penis that started two days ago. Pain 9/10. Pt denies any blood, reports yellow discharge.

## 2018-11-21 NOTE — ED Provider Notes (Signed)
[facility] EMERGENCY DEPARTMENT Provider Note   CSN: [id number] Arrival date & time: 11/21/18  0051     History   Chief Complaint Chief Complaint  Patient presents with  . Exposure to STD    HPI Oscar Sawyer is a 32 y.o. male.  Patient presents with sore in his left penis and penile discharge for the past 2 days.  Patient does have a new sexual partner and states he always uses a condom.  Only male partners.  No history of STDs.     Past Medical History:  Diagnosis Date  . Anxiety     There are no active problems to display for this patient.   History reviewed. No pertinent surgical history.      Home Medications    Prior to Admission medications   Medication Sig Start Date End Date Taking? Authorizing Provider  benzonatate (TESSALON) 100 MG capsule Take 1 capsule (100 mg total) by mouth 3 (three) times daily as needed. Patient not taking: Reported on 06/27/2018 03/30/18   Petrucelli, Lelon Mast R, PA-C  fluticasone (FLONASE) 50 MCG/ACT nasal spray Place 1 spray into both nostrils daily. Patient not taking: Reported on 06/27/2018 03/30/18   Petrucelli, Pleas Koch, PA-C  ibuprofen (ADVIL,MOTRIN) 800 MG tablet Take 1 tablet (800 mg total) by mouth 3 (three) times daily. Patient not taking: Reported on 06/27/2018 03/30/18   Petrucelli, Pleas Koch, PA-C    Family History No family history on file.  Social History Social History   Tobacco Use  . Smoking status: Current Some Day Smoker    Types: Cigarettes  . Smokeless tobacco: Never Used  Substance Use Topics  . Alcohol use: Yes    Comment: occ  . Drug use: Never     Allergies   Patient has no known allergies.   Review of Systems Review of Systems  Constitutional: Negative for fever.  Genitourinary: Positive for discharge. Negative for testicular pain.  Skin: Positive for rash.     Physical Exam Updated Vital Signs BP 126/84 (BP Location: Right Arm)   Pulse 68   Temp 98.3 F  (36.8 C) (Oral)   Resp 16   Ht 5\' 11"  (1.803 m)   Wt 79.4 kg   SpO2 99%   BMI 24.41 kg/m   Physical Exam  Constitutional: He is oriented to person, place, and time. He appears well-developed and well-nourished.  HENT:  Head: Normocephalic and atraumatic.  Eyes: Conjunctivae are normal. Right eye exhibits no discharge. Left eye exhibits no discharge.  Neck: Neck supple. No tracheal deviation present.  Cardiovascular: Normal rate.  Pulmonary/Chest: Effort normal.  Genitourinary:  Genitourinary Comments: Patient has superficial abrasion left mid shaft of penis, no drainage, no erythema, no vesicles.  No swelling to testicles.  Neurological: He is alert and oriented to person, place, and time.  Skin: Skin is warm. No rash noted.  Psychiatric: He has a normal mood and affect.  Nursing note and vitals reviewed.    ED Treatments / Results  Labs (all labs ordered are listed, but only abnormal results are displayed) Labs Reviewed  WET PREP, GENITAL  URINALYSIS, ROUTINE W REFLEX MICROSCOPIC  GC/CHLAMYDIA PROBE AMP ([place]) NOT AT [facility]    EKG None  Radiology No results found.  Procedures Procedures (including critical care time)  Medications Ordered in ED Medications  bacitracin ointment (has no administration in time range)  ibuprofen (ADVIL,MOTRIN) tablet 600 mg (has no administration in time range)  cefTRIAXone (ROCEPHIN) injection 250 mg (has  no administration in time range)  azithromycin (ZITHROMAX) tablet 1,000 mg (has no administration in time range)     Initial Impression / Assessment and Plan / ED Course  I have reviewed the triage vital signs and the nursing notes.  Pertinent labs & imaging results that were available during my care of the patient were reviewed by me and considered in my medical decision making (see chart for details).    Patient presents with clinical concern for STD.  Samples obtained sent to lab.  Prophylactic treatment antibiotics  given.  Topical antibiotics for abrasion.  D[facility] supportive care [facility]  Final Clinical Impressions(s) / ED Diagnoses   Final diagnoses:  Penile discharge  Possible exposure to STD    ED Discharge Orders    None       Blane OharaZavitz, Charlisa Cham, MD 11/21/18 [phone]

## 2018-11-21 NOTE — ED Notes (Signed)
ED Provider at bedside. 

## 2018-11-21 NOTE — Discharge Instructions (Signed)
[facility] and [facility].  Phone number is [phone].

## 2018-12-05 ENCOUNTER — Encounter (HOSPITAL_COMMUNITY): Payer: Self-pay | Admitting: Pharmacy Technician

## 2018-12-05 ENCOUNTER — Other Ambulatory Visit: Payer: Self-pay

## 2018-12-05 ENCOUNTER — Emergency Department (HOSPITAL_COMMUNITY)
Admission: EM | Admit: 2018-12-05 | Discharge: 2018-12-05 | Disposition: A | Discharge status: Home / Self Care | Payer: Self-pay | Attending: Emergency Medicine | Admitting: Emergency Medicine

## 2018-12-05 DIAGNOSIS — J111 Influenza due to unidentified influenza virus with other respiratory manifestations: Secondary | ICD-10-CM | POA: Insufficient documentation

## 2018-12-05 DIAGNOSIS — F17210 Nicotine dependence, cigarettes, uncomplicated: Secondary | ICD-10-CM | POA: Insufficient documentation

## 2018-12-05 MED ORDER — ONDANSETRON 4 MG PO TBDP
8.0000 mg | ORAL_TABLET | Freq: Once | ORAL | Status: AC
  Administered 2018-12-05: 8 mg via ORAL
  Filled 2018-12-05: qty 2

## 2018-12-05 MED ORDER — ACETAMINOPHEN 500 MG PO TABS
1000.0000 mg | ORAL_TABLET | Freq: Once | ORAL | Status: AC
  Administered 2018-12-05: 1000 mg via ORAL
  Filled 2018-12-05: qty 2

## 2018-12-05 MED ORDER — ONDANSETRON 8 MG PO TBDP: 8.0000 mg | ORAL_TABLET | Freq: Three times a day (TID) | ORAL | 0 refills | Status: AC | PRN

## 2018-12-05 MED ORDER — OSELTAMIVIR PHOSPHATE 75 MG PO CAPS: 75.0000 mg | ORAL_CAPSULE | Freq: Two times a day (BID) | ORAL | 0 refills | Status: AC

## 2018-12-05 NOTE — ED Notes (Signed)
Pt provided with orange juice and ice water

## 2018-12-05 NOTE — Discharge Instructions (Signed)
It was our pleasure to provide your ER care today - we hope that you feel better.  Rest. Drink plenty of fluids.  Take tamiflu as prescribed. You may take zofran as need for nausea.   Take acetaminophen and/or ibuprofen as need for fever and body aches/pain.   Follow up with primary care doctor in 1-2 weeks if symptoms fail to improve/resolve.  Return to ER if worse, new symptoms, increased trouble breathing, persistent vomiting, other concern.

## 2018-12-05 NOTE — ED Notes (Signed)
Patient verbalizes understanding of discharge instructions. Opportunity for questioning and answers were provided. 

## 2018-12-05 NOTE — ED Triage Notes (Signed)
Pt reports headache, body aches, sore throat, cough and nausea since yesterday.

## 2018-12-05 NOTE — ED Provider Notes (Signed)
[facility] Provider Note   CSN: [id number] Arrival date & time: 12/05/18  1750     History   Chief Complaint Chief Complaint  Patient presents with  . Influenza    HPI Oscar Sawyer is a 32 y.o. male.  Patient c/o sore throat, non prod cough, rhinorrhea/nasal congestion, fevers, body aches, onset yesterday. Symptoms acute onset, moderate, persistent. No definite known ill contacts. 1-2 loose stools today. No abd pain. Nausea, no vomiting. No dysuria. No rash. Mild headache earlier. No acute/abrupt or severe headaches. No neck pain or stiffness.   The history is provided by the patient.  Influenza  Presenting symptoms: cough, fever, nausea, rhinorrhea and sore throat   Presenting symptoms: no diarrhea, no shortness of breath and no vomiting   Associated symptoms: nasal congestion   Associated symptoms: no neck stiffness     Past Medical History:  Diagnosis Date  . Anxiety     There are no active problems to display for this patient.   History reviewed. No pertinent surgical history.      Home Medications    Prior to Admission medications   Medication Sig Start Date End Date Taking? Authorizing Provider  benzonatate (TESSALON) 100 MG capsule Take 1 capsule (100 mg total) by mouth 3 (three) times daily as needed. Patient not taking: Reported on 06/27/2018 03/30/18   Petrucelli, Lelon MastSamantha R, PA-C  fluticasone (FLONASE) 50 MCG/ACT nasal spray Place 1 spray into both nostrils daily. Patient not taking: Reported on 06/27/2018 03/30/18   Petrucelli, Pleas KochSamantha R, PA-C  ibuprofen (ADVIL,MOTRIN) 800 MG tablet Take 1 tablet (800 mg total) by mouth 3 (three) times daily. Patient not taking: Reported on 06/27/2018 03/30/18   Petrucelli, Pleas KochSamantha R, PA-C    Family History No family history on file.  Social History Social History   Tobacco Use  . Smoking status: Current Some Day Smoker    Types: Cigarettes  . Smokeless tobacco: Never  Used  Substance Use Topics  . Alcohol use: Yes    Comment: occ  . Drug use: Never     Allergies   Patient has no known allergies.   Review of Systems Review of Systems  Constitutional: Positive for fever.  HENT: Positive for congestion, rhinorrhea and sore throat. Negative for trouble swallowing.   Eyes: Negative for discharge and redness.  Respiratory: Positive for cough. Negative for shortness of breath.   Cardiovascular: Negative for chest pain and leg swelling.  Gastrointestinal: Positive for nausea. Negative for abdominal pain, diarrhea and vomiting.  Endocrine: Negative for polyuria.  Genitourinary: Negative for dysuria and flank pain.  Musculoskeletal: Negative for back pain, neck pain and neck stiffness.  Skin: Negative for rash.  Neurological: Negative for numbness.  Hematological: Does not bruise/bleed easily.  Psychiatric/Behavioral: Negative for confusion.     Physical Exam Updated Vital Signs BP 118/61   Pulse 82   Temp 100.1 F (37.8 C) (Oral)   Resp 16   Ht 1.803 m (5\' 11" )   Wt 79.3 kg   SpO2 99%   BMI 24.38 kg/m   Physical Exam Vitals signs and nursing note reviewed.  Constitutional:      Appearance: Normal appearance. He is well-developed.  HENT:     Head: Atraumatic.     Right Ear: Tympanic membrane normal.     Left Ear: Tympanic membrane normal.     Nose: Congestion and rhinorrhea present.     Mouth/Throat:     Mouth: Mucous membranes are moist.  Pharynx: Oropharynx is clear. No oropharyngeal exudate.  Eyes:     General: No scleral icterus.       Right eye: No discharge.        Left eye: No discharge.     Conjunctiva/sclera: Conjunctivae normal.  Neck:     Musculoskeletal: Normal range of motion and neck supple. No neck rigidity or muscular tenderness.     Trachea: No tracheal deviation.     Comments: No stiffness or rigidity. Cardiovascular:     Rate and Rhythm: Normal rate and regular rhythm.     Pulses: Normal pulses.      Heart sounds: Normal heart sounds. No murmur. No friction rub. No gallop.   Pulmonary:     Effort: Pulmonary effort is normal. No accessory muscle usage or respiratory distress.     Breath sounds: Normal breath sounds.  Abdominal:     General: Bowel sounds are normal. There is no distension.     Palpations: Abdomen is soft.     Tenderness: There is no abdominal tenderness.  Genitourinary:    Comments: No cva tenderness.  Musculoskeletal:        General: No swelling or tenderness.  Lymphadenopathy:     Cervical: No cervical adenopathy.  Skin:    General: Skin is warm and dry.     Findings: No rash.  Neurological:     Mental Status: He is alert.     Comments: Speech clear/fluent. Steady gait.   Psychiatric:        Mood and Affect: Mood normal.      ED Treatments / Results  Labs (all labs ordered are listed, but only abnormal results are displayed) Labs Reviewed - No data to display  EKG None  Radiology No results found.  Procedures Procedures (including critical care time)  Medications Ordered in ED Medications  ondansetron (ZOFRAN-ODT) disintegrating tablet 8 mg (has no administration in time range)  acetaminophen (TYLENOL) tablet 1,000 mg (has no administration in time range)     Initial Impression / Assessment and Plan / ED Course  I have reviewed the triage vital signs and the nursing notes.  Pertinent labs & imaging results that were available during my care of the patient were reviewed by me and considered in my medical decision making (see chart for details).  zofran po. Acetaminophen po. Po fluids.  Pt tolerates po fluids.   No increased wob. abd exam benign.   Symptoms felt most c/w flu or flu-like illness.  rx tamiflu.   Return precautions provided.     Final Clinical Impressions(s) / ED Diagnoses   Final diagnoses:  None    ED Discharge Orders    None       Cathren LaineSteinl, Azlyn Wingler, MD 12/05/18 1811

## 2018-12-07 ENCOUNTER — Other Ambulatory Visit: Payer: Self-pay

## 2018-12-07 ENCOUNTER — Encounter (HOSPITAL_COMMUNITY): Payer: Self-pay

## 2018-12-07 ENCOUNTER — Emergency Department (HOSPITAL_COMMUNITY): Payer: Self-pay

## 2018-12-07 ENCOUNTER — Emergency Department (HOSPITAL_COMMUNITY)
Admission: EM | Admit: 2018-12-07 | Discharge: 2018-12-07 | Disposition: A | Discharge status: Home / Self Care | Payer: Self-pay | Attending: Emergency Medicine | Admitting: Emergency Medicine

## 2018-12-07 DIAGNOSIS — F17210 Nicotine dependence, cigarettes, uncomplicated: Secondary | ICD-10-CM | POA: Insufficient documentation

## 2018-12-07 DIAGNOSIS — J111 Influenza due to unidentified influenza virus with other respiratory manifestations: Secondary | ICD-10-CM | POA: Insufficient documentation

## 2018-12-07 LAB — CBC WITH DIFFERENTIAL/PLATELET
Abs Immature Granulocytes: 0.02 10*3/uL (ref 0.00–0.07)
BASOS ABS: 0 10*3/uL (ref 0.0–0.1)
Basophils Relative: 0 %
Eosinophils Absolute: 0 10*3/uL (ref 0.0–0.5)
Eosinophils Relative: 0 %
HEMATOCRIT: 47.4 % (ref 39.0–52.0)
Hemoglobin: 15.6 g/dL (ref 13.0–17.0)
Immature Granulocytes: 1 %
LYMPHS ABS: 0.8 10*3/uL (ref 0.7–4.0)
Lymphocytes Relative: 21 %
MCH: 30.5 pg (ref 26.0–34.0)
MCHC: 32.9 g/dL (ref 30.0–36.0)
MCV: 92.8 fL (ref 80.0–100.0)
Monocytes Absolute: 0.4 10*3/uL (ref 0.1–1.0)
Monocytes Relative: 11 %
Neutro Abs: 2.5 10*3/uL (ref 1.7–7.7)
Neutrophils Relative %: 67 %
Platelets: 111 10*3/uL — ABNORMAL LOW (ref 150–400)
RBC: 5.11 MIL/uL (ref 4.22–5.81)
RDW: 11.8 % (ref 11.5–15.5)
WBC: 3.7 10*3/uL — ABNORMAL LOW (ref 4.0–10.5)
nRBC: 0 % (ref 0.0–0.2)

## 2018-12-07 LAB — BASIC METABOLIC PANEL
ANION GAP: 9 (ref 5–15)
BUN: 15 mg/dL (ref 6–20)
CO2: 26 mmol/L (ref 22–32)
Calcium: 8.3 mg/dL — ABNORMAL LOW (ref 8.9–10.3)
Chloride: 101 mmol/L (ref 98–111)
Creatinine, Ser: 1.28 mg/dL — ABNORMAL HIGH (ref 0.61–1.24)
GFR calc non Af Amer: >60 mL/min (ref >60)
GLUCOSE: 106 mg/dL — AB (ref 70–99)
Potassium: 3.7 mmol/L (ref 3.5–5.1)
Sodium: 136 mmol/L (ref 135–145)

## 2018-12-07 LAB — URINALYSIS, ROUTINE W REFLEX MICROSCOPIC
Bilirubin Urine: NEGATIVE
Glucose, UA: NEGATIVE mg/dL
Hgb urine dipstick: NEGATIVE
Ketones, ur: NEGATIVE mg/dL
Leukocytes, UA: NEGATIVE
Nitrite: NEGATIVE
Protein, ur: NEGATIVE mg/dL
Specific Gravity, Urine: 1.017 (ref 1.005–1.030)
pH: 6 (ref 5.0–8.0)

## 2018-12-07 MED ORDER — METHYLPREDNISOLONE 4 MG PO TBPK: ORAL_TABLET | ORAL | 0 refills | Status: AC

## 2018-12-07 MED ORDER — ACETAMINOPHEN 325 MG PO TABS
650.0000 mg | ORAL_TABLET | Freq: Once | ORAL | Status: AC
  Administered 2018-12-07: 650 mg via ORAL
  Filled 2018-12-07: qty 2

## 2018-12-07 MED ORDER — ALBUTEROL SULFATE 108 (90 BASE) MCG/ACT IN AEPB: 2.0000 | INHALATION_SPRAY | RESPIRATORY_TRACT | 0 refills | Status: AC | PRN

## 2018-12-07 MED ORDER — PHENYLEPHRINE HCL 10 MG PO TABS
10.0000 mg | ORAL_TABLET | ORAL | Status: DC | PRN
  Filled 2018-12-07: qty 1

## 2018-12-07 MED ORDER — SODIUM CHLORIDE 0.9 % IV BOLUS
1000.0000 mL | Freq: Once | INTRAVENOUS | Status: AC
  Administered 2018-12-07: 1000 mL via INTRAVENOUS

## 2018-12-07 MED ORDER — ALBUTEROL SULFATE 108 (90 BASE) MCG/ACT IN AEPB: 2.0000 | INHALATION_SPRAY | RESPIRATORY_TRACT | 0 refills | Status: DC | PRN

## 2018-12-07 MED ORDER — IPRATROPIUM-ALBUTEROL 0.5-2.5 (3) MG/3ML IN SOLN
3.0000 mL | Freq: Once | RESPIRATORY_TRACT | Status: AC
  Administered 2018-12-07: 3 mL via RESPIRATORY_TRACT
  Filled 2018-12-07: qty 3

## 2018-12-07 MED ORDER — DM-GUAIFENESIN ER 30-600 MG PO TB12
1.0000 | ORAL_TABLET | Freq: Two times a day (BID) | ORAL | Status: DC
  Administered 2018-12-07: 1 via ORAL
  Filled 2018-12-07: qty 1

## 2018-12-07 NOTE — ED Provider Notes (Signed)
[facility] Provider Note   CSN: [id number] Arrival date & time: 12/07/18  1136     History   Chief Complaint Chief Complaint  Patient presents with  . Influenza    HPI Oscar Sawyer is a 32 y.o. male who returns to the ED for flu sxs 2 days after diagnosis of flu like illness 2 days ago. He is on tamiflu. Patient c/o bodyaches,, fatigue, malaise, nausea, cough, wheezing.  He denies vomiting.  Patient took Aleve this morning without significant relief.  He has been taking Tamiflu and states that that he is "not feeling any better."  Patient is a non-smoker.  He denies a history of asthma.  HPI  Past Medical History:  Diagnosis Date  . Anxiety     There are no active problems to display for this patient.   History reviewed. No pertinent surgical history.      Home Medications    Prior to Admission medications   Medication Sig Start Date End Date Taking? Authorizing Provider  benzonatate (TESSALON) 100 MG capsule Take 1 capsule (100 mg total) by mouth 3 (three) times daily as needed. Patient not taking: Reported on 06/27/2018 03/30/18   Petrucelli, Lelon MastSamantha R, PA-C  fluticasone (FLONASE) 50 MCG/ACT nasal spray Place 1 spray into both nostrils daily. Patient not taking: Reported on 06/27/2018 03/30/18   Petrucelli, Pleas KochSamantha R, PA-C  ibuprofen (ADVIL,MOTRIN) 800 MG tablet Take 1 tablet (800 mg total) by mouth 3 (three) times daily. Patient not taking: Reported on 06/27/2018 03/30/18   Petrucelli, Samantha R, PA-C  ondansetron (ZOFRAN ODT) 8 MG disintegrating tablet Take 1 tablet (8 mg total) by mouth every 8 (eight) hours as needed for nausea or vomiting. 12/05/18   Cathren LaineSteinl, Kevin, MD  oseltamivir (TAMIFLU) 75 MG capsule Take 1 capsule (75 mg total) by mouth every 12 (twelve) hours. 12/05/18   Cathren LaineSteinl, Kevin, MD    Family History No family history on file.  Social History Social History   Tobacco Use  . Smoking status: Current Some  Day Smoker    Types: Cigarettes  . Smokeless tobacco: Never Used  Substance Use Topics  . Alcohol use: Yes    Comment: occ  . Drug use: Never     Allergies   Patient has no known allergies.   Review of Systems Review of Systems Ten systems reviewed and are negative for acute change, except as noted in the HPI.    Physical Exam Updated Vital Signs BP 103/72   Pulse 74   SpO2 93%   Physical Exam Constitutional:      Appearance: Normal appearance. He is ill-appearing. He is not toxic-appearing or diaphoretic.  HENT:     Head: Normocephalic and atraumatic.     Right Ear: Tympanic membrane normal.     Left Ear: Tympanic membrane normal.     Nose: Congestion present. No rhinorrhea.     Mouth/Throat:     Mouth: Mucous membranes are moist.  Eyes:     Extraocular Movements: Extraocular movements intact.     Pupils: Pupils are equal, round, and reactive to light.  Neck:     Musculoskeletal: Normal range of motion and neck supple. No neck rigidity.  Cardiovascular:     Rate and Rhythm: Normal rate and regular rhythm.     Pulses: Normal pulses.  Pulmonary:     Effort: Pulmonary effort is normal.     Breath sounds: Wheezing present. No rhonchi.  Abdominal:     General:  Abdomen is flat. There is no distension.     Palpations: Abdomen is soft.     Tenderness: There is no abdominal tenderness.  Skin:    General: Skin is warm and dry.     Capillary Refill: Capillary refill takes less than 2 seconds.     Coloration: Skin is not jaundiced.     Findings: No rash.  Neurological:     General: No focal deficit present.     Mental Status: He is alert and oriented to person, place, and time.  Psychiatric:        Thought Content: Thought content normal.      ED Treatments / Results  Labs (all labs ordered are listed, but only abnormal results are displayed) Labs Reviewed  CBC WITH DIFFERENTIAL/PLATELET - Abnormal; Notable for the following components:      Result Value    WBC 3.7 (*)    Platelets 111 (*)    All other components within normal limits  BASIC METABOLIC PANEL - Abnormal; Notable for the following components:   Glucose, Bld 106 (*)    Creatinine, Ser 1.28 (*)    Calcium 8.3 (*)    All other components within normal limits  URINALYSIS, ROUTINE W REFLEX MICROSCOPIC    EKG None  Radiology No results found.  Procedures Procedures (including critical care time)  Medications Ordered in ED Medications  dextromethorphan-guaiFENesin (MUCINEX DM) 30-600 MG per 12 hr tablet 1 tablet (1 tablet Oral Given 12/07/18 1330)  sodium chloride 0.9 % bolus 1,000 mL (1,000 mLs Intravenous New Bag/Given 12/07/18 1330)  ipratropium-albuterol (DUONEB) 0.5-2.5 (3) MG/3ML nebulizer solution 3 mL (3 mLs Nebulization Given 12/07/18 1318)  acetaminophen (TYLENOL) tablet 650 mg (650 mg Oral Given 12/07/18 1330)     Initial Impression / Assessment and Plan / ED Course  I have reviewed the triage vital signs and the nursing notes.  Pertinent labs & imaging results that were available during my care of the patient were reviewed by me and considered in my medical decision making (see chart for details).     Patient with symptoms consistent with influenza.  Vitals are stable, low-grade fever.  No signs of dehydration, tolerating PO's.has wheezing in the setting of influenza but chest x-ray is negative and improved after DuoNeb.  Discussed the cost versus benefit of Tamiflu treatment with the patient.    Patient will be discharged with instructions to orally hydrate, rest, and use over-the-counter medications such as anti-inflammatories ibuprofen and Aleve for muscle aches and Tylenol for fever.  Patient will also be given a cough suppressant.    Final Clinical Impressions(s) / ED Diagnoses   Final diagnoses:  Influenza    ED Discharge Orders    None       Arthor CaptainHarris, Sylas Twombly, PA-C 12/07/18 1631    Alvira MondaySchlossman, Erin, MD 12/10/18 [phone]

## 2018-12-07 NOTE — ED Notes (Signed)
Xray notified that pt is ready for imaging  

## 2018-12-07 NOTE — ED Notes (Signed)
Pt verbalized understanding of d/c instructions and has no further questions, VSS, NAD.  

## 2018-12-07 NOTE — ED Notes (Signed)
Pt moved to progressive bed. Resting in bed.

## 2018-12-07 NOTE — ED Triage Notes (Signed)
Per [facility]: Pt diagnosed with the flu two days ago. States the has been "trying to take the tamiflu". Pt complaining of headache, body aches, and nausea. Pt states that he is still urinating. States that he took 2 pills of alieve PTA.

## 2018-12-07 NOTE — ED Notes (Signed)
Pt. Came into [facility] and needed a work note for today's visit.  Work note given.

## 2018-12-07 NOTE — Discharge Instructions (Addendum)
Take dayquil/ nyquil alontg with the medications I have prescribed.  Follow these instructions at home: Take over-the-counter and prescription medicines only as told by your health care provider. Use a cool mist humidifier to add humidity to the air in your home. This can make breathing easier. Rest as needed. Drink enough fluid to keep your urine clear or pale yellow. Cover your mouth and nose when you cough or sneeze. Wash your hands with soap and water often, especially after you cough or sneeze. If soap and water are not available, use hand sanitizer. Stay home from work or school as told by your health care provider. Unless you are visiting your health care provider, try to avoid leaving home until your fever has been gone for 24 hours without the use of medicine. Keep all follow-up visits as told by your health care provider. This is important. Contact a health care provider if: You develop new symptoms. You have: Chest pain. Diarrhea. A fever. Your cough gets worse. You produce more mucus. You feel nauseous or you vomit. Get help right away if: You develop shortness of breath or difficulty breathing. Your skin or nails turn a bluish color. You have severe pain or stiffness in your neck. You develop a sudden headache or sudden pain in your face or ear. You cannot stop vomiting.

## 2019-03-07 NOTE — Congregational Nurse Program (Signed)
  Dept: [phone]   Congregational Nurse Program Note  Date of Encounter: 03/07/2019  Past Medical History: Past Medical History:  Diagnosis Date  . Anxiety     Encounter Details: CNP Questionnaire - 03/07/19 1715      Questionnaire   Patient Status  Not Applicable    Race  Black or African American    Location Patient Served At  Not Applicable    Insurance  Not Applicable    Uninsured  Uninsured (Subsequent visits/quarter)    Food  No food insecurities    Housing/Utilities  No permanent housing    Transportation  No transportation needs    Interpersonal Safety  Yes, feel physically and emotionally safe where you currently live    Medication  Yes, have medication insecurities    Medical Provider  No    Referrals  Primary Care Provider/Clinic    ED Visit Averted  Not Applicable    Life-Saving Intervention Made  Not Applicable     Brief encounter ,states he is feeling better today . Read the materials given to him and tried some breathing exercises and feels better.  Follow weekly

## 2019-03-07 NOTE — Congregational Nurse Program (Signed)
  Dept: [phone]   Congregational Nurse Program Note  Date of Encounter: 03/06/2019  Past Medical History: Past Medical History:  Diagnosis Date  . Anxiety     Encounter Details: CNP Questionnaire - 03/07/19 1659      Questionnaire   Patient Status  Not Applicable    Race  Black or African American    Location Patient Served At  Not Applicable    Insurance  Not Applicable    Uninsured  Uninsured (NEW 1x/quarter)    Food  Yes, have food insecurities;Within past 12 months, worried food would run out with no money to buy more    Housing/Utilities  No permanent housing    Transportation  No transportation needs    Interpersonal Safety  Yes, feel physically and emotionally safe where you currently live    Medication  Yes, have medication insecurities    Medical Provider  No    Referrals  Area Agency;Primary Care Provider/Clinic    ED Visit Averted  Not Applicable    Life-Saving Intervention Made  Not Applicable      Initial visit to see nurse states he is prediabetic and has anxiety attacks ,doesn't eat pork ,works but is on lock down due to Covid -19 for 14 days and has never had to do this . Feeling a little overwhelmed . Nurse allowed client to talk and then made suggestions on how to deal with the stress of being on lock down for 14 days. Information sheet given and information on panic/anxiety disorders and how to deal with them . States he has always worked was working at  [employer] before lock down  ,no children ,was on anxiety medication 1 year ago had not  had an anxiety  attack until this week has managed to handle it ,has no PCP . Moved from [place] . Nurse  Counseled regarding  triggers that may bring on an anxiety  attack and being confined was acknowledged as a lifestyle change that has brought  On a lot of stress for him .   Nurse will speak with case management to allow him to get more vegetables since he  Does not eat   pork on  Days that it is served. .   After session he seemed to calm down ,reviewed his vitals with him and what prediabetes is . Needs a PCP ,given sheet to call for an appointment  As routine visits are not being scheduled right now  Follow weekly.

## 2020-01-05 IMAGING — CR DG CHEST 2V
2 series · 2 of 2 positions shown · non-contrast
Comparison: March 30, 2018

CLINICAL DATA: Shortness of breath

EXAM:
CHEST - 2 VIEW

[chest lat]
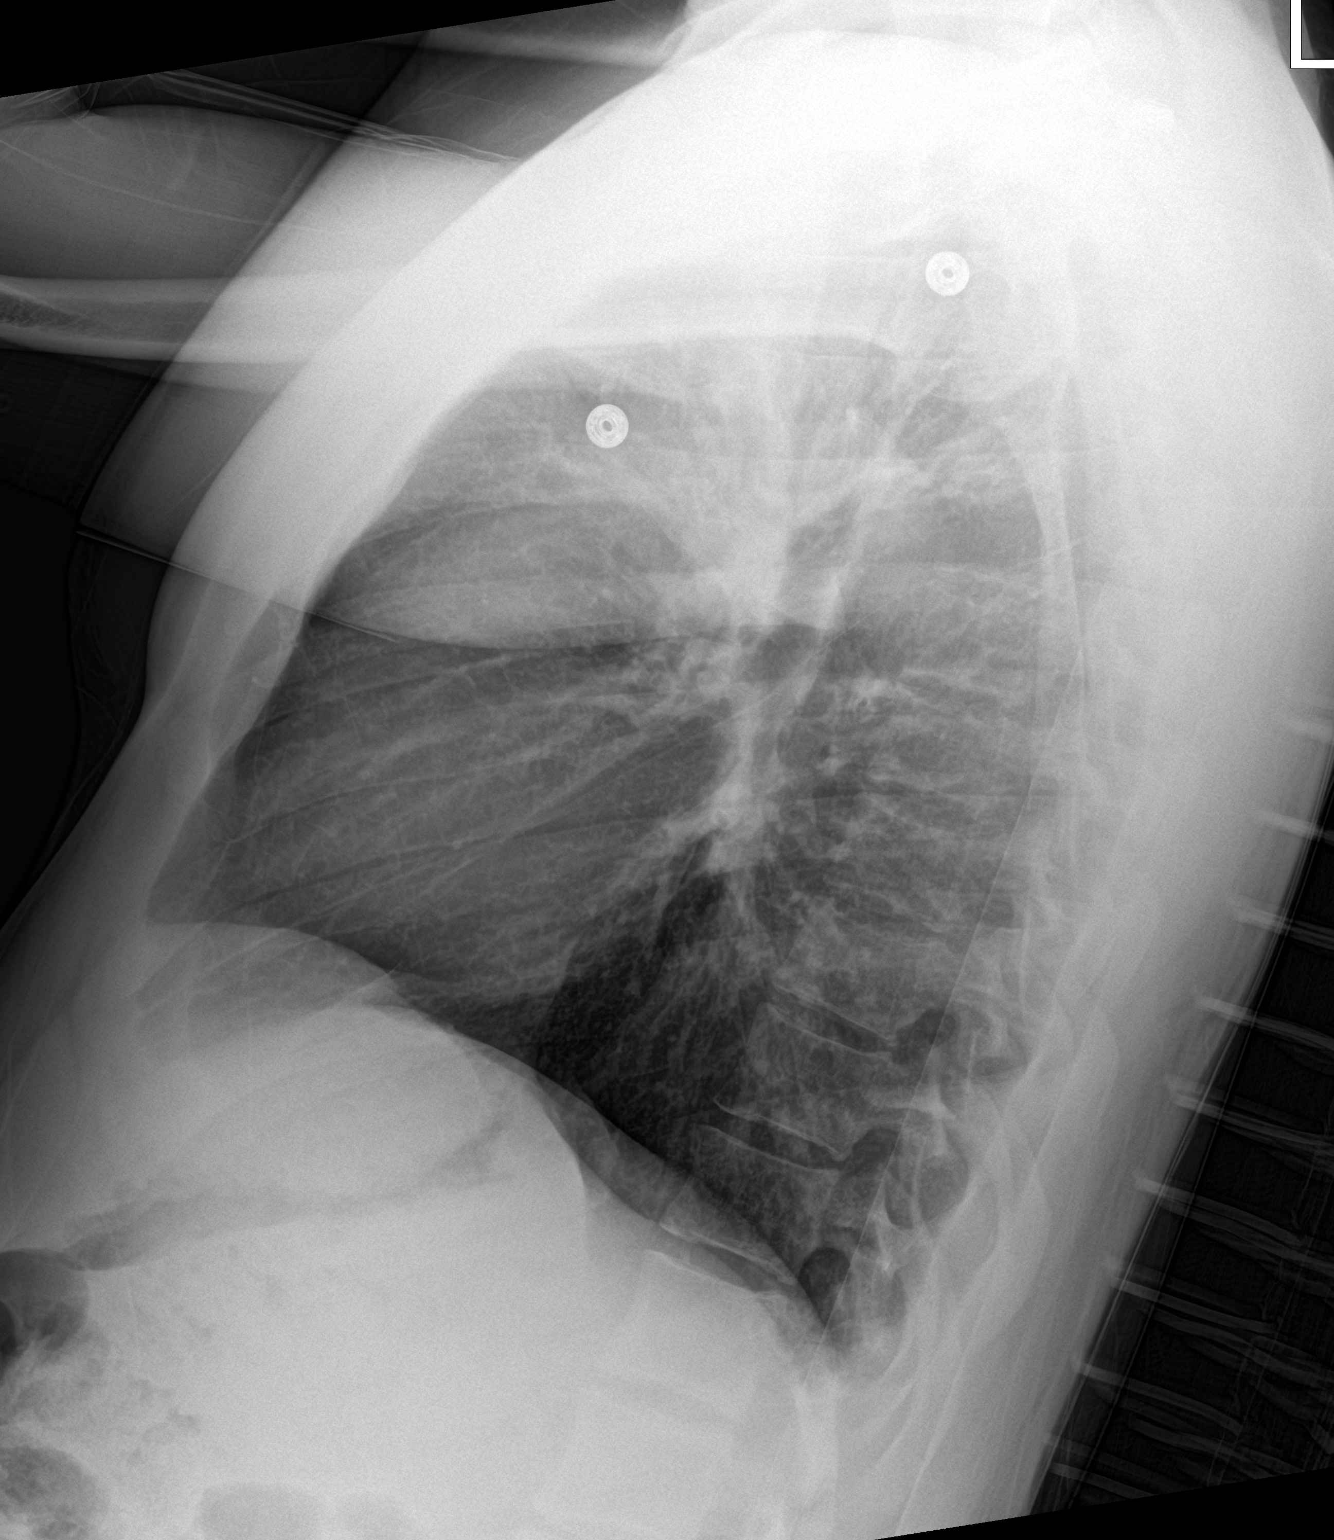

[chest ap]
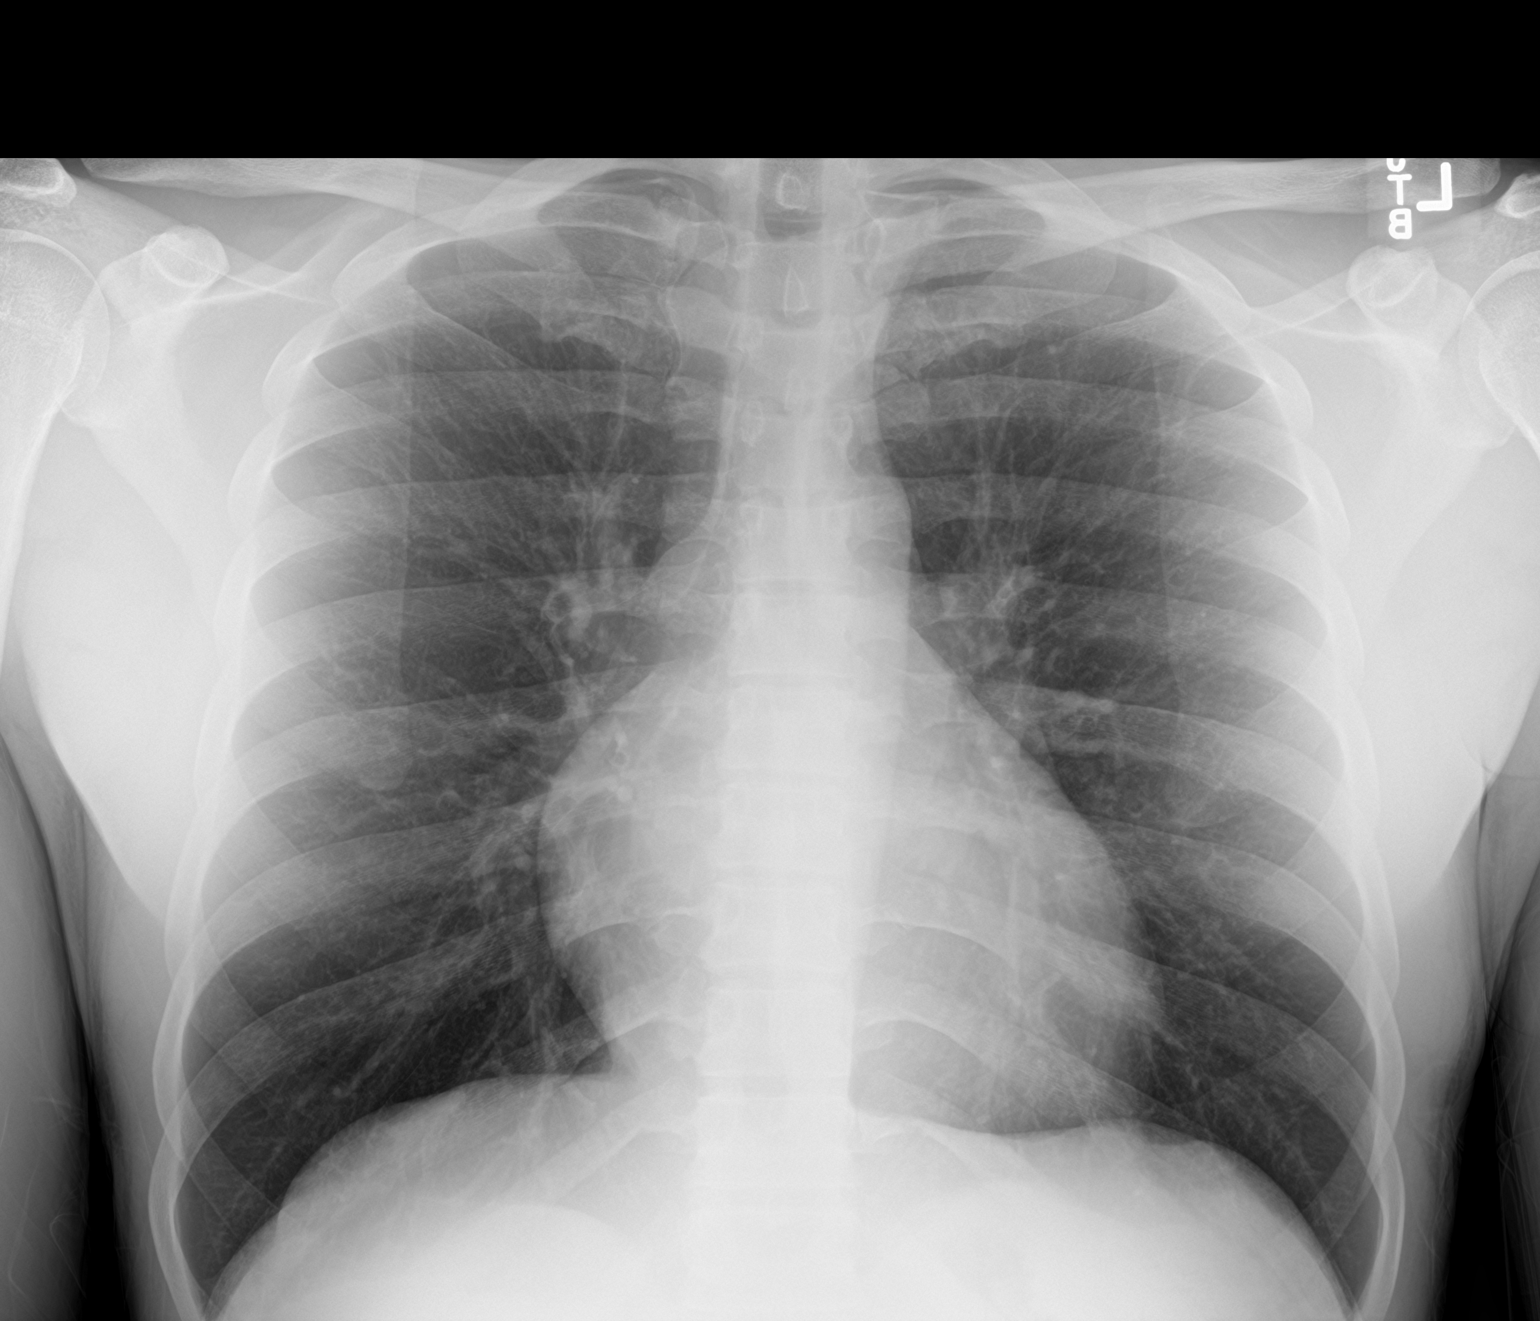

[2 of 2 positions shown; findings below may reference images not displayed]

FINDINGS: Lungs are clear. The heart size and pulmonary vascularity are
normal. No adenopathy. No bone lesions.
IMPRESSION: No edema or consolidation.

## 2023-06-11 ENCOUNTER — Emergency Department
Admission: EM | Admit: 2023-06-11 | Discharge: 2023-06-11 | Disposition: A | Discharge status: Home / Self Care | Payer: MEDICAID | Attending: Medical | Admitting: Medical

## 2023-06-11 ENCOUNTER — Other Ambulatory Visit: Payer: Self-pay

## 2023-06-11 DIAGNOSIS — A64 Unspecified sexually transmitted disease: Secondary | ICD-10-CM | POA: Insufficient documentation

## 2023-06-11 DIAGNOSIS — R369 Urethral discharge, unspecified: Secondary | ICD-10-CM | POA: Insufficient documentation

## 2023-06-11 LAB — URINALYSIS AND CULTURE IF IND
Glucose Urine: NEGATIVE mg/dL
Nitrite Urine: NEGATIVE
Occult Blood Urine: NEGATIVE mg/dL
Protein Urine: 30 mg/dL — AB
RBC: 2 /HPF (ref 0–2)
Specific Gravity, Urine: 1.031 — ABNORMAL HIGH (ref 1.002–1.030)
Squamous EPI: <1 /HPF (ref <=10)
Urobilinogen: 2 mg/dL (ref ?–2.0)
WBC, Urine: 5 /HPF (ref 0–5)
pH URINE: 6 (ref 4.8–7.8)

## 2023-06-11 LAB — SYPHILIS IGG/IGM AB WITH REFLEX: Syphilis IgG/IgM Ab with Reflex: NONREACTIVE

## 2023-06-11 LAB — ED HCV SCREEN WITH REFLEX: Hepatitis C Ab Screen: NONREACTIVE

## 2023-06-11 LAB — HIV AG/AB COMBO SCREEN: HIV Ag/Ab Combo Screen: NONREACTIVE

## 2023-06-11 MED ORDER — CEFTRIAXONE 350 MG/ML 500 MG VIAL FOR IM
500.0000 mg | Freq: Once | INTRAMUSCULAR | Status: AC
Start: 2023-06-11 — End: 2023-06-11
  Administered 2023-06-11: 500 mg via INTRAMUSCULAR
  Filled 2023-06-11: qty 5

## 2023-06-11 MED ORDER — LIDOCAINE HCL 10 MG/ML (1 %) INJECTION SOLUTION
1.0000 mL | Freq: Once | INTRAMUSCULAR | Status: AC
Start: 2023-06-11 — End: 2023-06-11
  Administered 2023-06-11: 1 mL via INTRAMUSCULAR
  Filled 2023-06-11: qty 10

## 2023-06-11 MED ORDER — DOXYCYCLINE HYCLATE 100 MG TABLET
100.0000 mg | ORAL_TABLET | Freq: Once | ORAL | Status: AC
Start: 2023-06-11 — End: 2023-06-11
  Administered 2023-06-11: 100 mg via ORAL
  Filled 2023-06-11: qty 1

## 2023-06-11 MED ORDER — DOXYCYCLINE HYCLATE 100 MG TABLET
100.0000 mg | ORAL_TABLET | Freq: Two times a day (BID) | ORAL | 0 refills | Status: AC
Start: 2023-06-11 — End: 2023-06-18
  Filled 2023-06-11: qty 14, 7d supply, fill #0

## 2023-06-11 NOTE — ED Provider Notes (Signed)
EMERGENCY DEPARTMENT PHYSICIAN NOTE - [facility]      Date of Service: 06/11/2023 10:01 AM Patient's PCP: Patient, No Pcp Per   Note Started: 06/11/2023 12:32 DOB: 1986/05/19      Chief Complaint   Patient presents with    Sexually Transmitted Disease     Wants STD panel.       Triage  Orders  Workup  Results Grouped  Results Report  ECGs  Micro  VS/Meds   RN Notes  Chart Review   Dispo:18486  The history provided by the patient.       Alcee Pebbles is a 37yr old male, who  Past Medical History:15460, presenting to the ED for penile discharge. He is requesting an STD panel. He was with a new partner 2 weeks ago and now wants to be tested for HIV.        HISTORY: Enter/Edit History:18486  No past medical history on file. There are no problems to display for this patient.     History reviewed. No pertinent surgical history. No current outpatient medications on file.      Social History     Social History Narrative    Not on file    No family history on file.     TRIAGE VITAL SIGNS:  Temp: 37.5 C (99.5 F) (06/11/23 1005)  Temp src: Oral (06/11/23 1005)  Pulse: 88 (06/11/23 1005)  BP: 125/83 (06/11/23 1005)  Resp: 18 (06/11/23 1005)   SpO2: 95 % (06/11/23 1005)  Weight: 78.2 kg (172 lb 6.4 oz) (06/11/23 1005)    Physical Exam         MEDICAL DECISION MAKING  Differential includes, but is not limited to: ***      SCRIBE DATA - For Scribes (Optional):32227   Results / Chart Review / CDS (.EDDATA) (Optional):950033       Triage  Orders  Workup  Results Grouped  Results Report  ECGs  Micro  VS/Meds   RN Notes  Chart Review   Dispo:18486  PATIENT SUMMARY:   Shabaka Flattery is a 37yr old male ***              DISPOSITION [.EDDISPOSITION]:950063    Clinical Impression: List of diagnoses - please be specific :18486  ***    LAST VITAL SIGNS  Temp: 37.5 C (99.5 F) (06/11/23 1005)  Temp src: 1  Pulse: 88 (06/11/23 1005)  Blood Pressure: 125/83 (06/11/23 1005)  Resp Rate: 18 (06/11/23 1005)  SpO2:  95 (06/11/23 1005)  Weight: 78.2 (172 lb 6.4 oz) (06/11/23 1005)    PATIENT'S GENERAL CONDITION:   [facility] PATIENT CONDITION:32407     ED SCRIBE SIGNATURE - PLEASE ADD YOUR SIGNATURE TO THE NOTE BY CHOOSING THE "SIGN NOTE" EXBMWU:13244     ED RESIDENT/APP/ATTENDING SIGNATURE:25274

## 2023-06-11 NOTE — ED Rapid Medical Evaluation (Signed)
Emergency Department Rapid Medical Evaluation Note  Chief Complaint   Patient presents with    Sexually Transmitted Disease     Wants STD panel.        Douglas Moreno is a 37yr old male who presents to the ED with request for STI testing.         Plan: lab(s) and/or imaging and complete ED evaluation by an ED Treatment Team.              Electronically signed by: Noel Gerold, MD     This patient was seen as part of a rapid medical evaluation. Please see the ED Provider Note for full details of this patient's care. This note is not intended to be a comprehensive document of the patient's ED visit and does not represent documentation of a medical screening exam.    This patient was instructed that this preliminary assessment and any studies obtained do not constitute the patient's full workup.

## 2023-06-11 NOTE — ED Triage Note (Signed)
Pt ambulatory to triage.  Here for possible STD exposure.  GCS 15

## 2023-06-11 NOTE — Discharge Instructions (Signed)
Emergency Department nursing documentation was reviewed including triage complaint, associated symptoms, administration of medications, response to therapy and vital signs. Given the history, physical exam, and review of any performed laboratory and imaging studies the patient is being discharged in stable condition. I advised the patient to follow up with their outpatient provider for further diagnostic testing and treatments as needed. Additional verbal discharge instructions as well as return precautions were reviewed with the patient, and the patient is in agreement with the plan.

## 2023-06-11 NOTE — ED Nursing Note (Signed)
Pt given DC papers, questions answered, vss, anox4, gcs 15, ambulated to lobby for dc

## 2023-06-12 LAB — CULTURE URINE, BACTI: URINE CULTURE: NO GROWTH

## 2023-06-13 LAB — CHLAMYDIA/GONORRHOEAE URINE
Chlamydia Trachomatis Urine: NEGATIVE
Neisseria Gonorrhoeae Urine: NEGATIVE
